# Patient Record
Sex: Male | Born: 1935 | Race: White | Hispanic: No | Marital: Single
Health system: Southern US, Community
[De-identification: ages and names within clinical notes are randomized; demographics above are authoritative.]

---

## 2011-02-27 ENCOUNTER — Ambulatory Visit: Payer: Self-pay | Admitting: Internal Medicine

## 2011-03-15 ENCOUNTER — Inpatient Hospital Stay: Payer: Self-pay | Admitting: Internal Medicine

## 2011-03-29 ENCOUNTER — Ambulatory Visit: Payer: Self-pay | Admitting: Internal Medicine

## 2011-08-26 ENCOUNTER — Observation Stay: Payer: Self-pay | Admitting: Specialist

## 2011-08-26 LAB — URINALYSIS, COMPLETE
Bilirubin,UR: NEGATIVE
Glucose,UR: NEGATIVE mg/dL (ref 0–75)
Hyaline Cast: 3
Leukocyte Esterase: NEGATIVE
Nitrite: POSITIVE
Protein: NEGATIVE
Specific Gravity: 1.012 (ref 1.003–1.030)
Squamous Epithelial: 1

## 2011-08-26 LAB — COMPREHENSIVE METABOLIC PANEL
Albumin: 3.1 g/dL — ABNORMAL LOW (ref 3.4–5.0)
Anion Gap: 8 (ref 7–16)
BUN: 66 mg/dL — ABNORMAL HIGH (ref 7–18)
Bilirubin,Total: 0.6 mg/dL (ref 0.2–1.0)
Chloride: 97 mmol/L — ABNORMAL LOW (ref 98–107)
Co2: 31 mmol/L (ref 21–32)
Glucose: 121 mg/dL — ABNORMAL HIGH (ref 65–99)
SGPT (ALT): 16 U/L
Sodium: 136 mmol/L (ref 136–145)
Total Protein: 9.2 g/dL — ABNORMAL HIGH (ref 6.4–8.2)

## 2011-08-26 LAB — CK TOTAL AND CKMB (NOT AT ARMC): CK, Total: 437 U/L — ABNORMAL HIGH (ref 35–232)

## 2011-08-26 LAB — CBC WITH DIFFERENTIAL/PLATELET
Basophil #: 0 10*3/uL (ref 0.0–0.1)
Basophil %: 0.3 %
Eosinophil #: 0 10*3/uL (ref 0.0–0.7)
HGB: 12.5 g/dL — ABNORMAL LOW (ref 13.0–18.0)
Lymphocyte #: 1.1 10*3/uL (ref 1.0–3.6)
Lymphocyte %: 7.7 %
MCHC: 33.1 g/dL (ref 32.0–36.0)
Monocyte #: 1 x10 3/mm (ref 0.2–1.0)
Monocyte %: 7.1 %
RDW: 15.8 % — ABNORMAL HIGH (ref 11.5–14.5)

## 2011-09-27 DEATH — deceased

## 2013-08-07 IMAGING — CT CT HEAD WITHOUT CONTRAST
2 series · 16 of 30 positions shown, 20 images · non-contrast
Comparison: none

REASON FOR EXAM: ams
COMMENTS:

PROCEDURE:     CT  - CT HEAD WITHOUT CONTRAST  - August 26, 2011  [DATE]
RESULT:     History: Altered mental status.

[Series 2: without · axial · non-contrast · 0.42mm/px · z∈[+172,+308]mm · 13 of 33 slices shown, 17 images]
[im 3/33  brain]
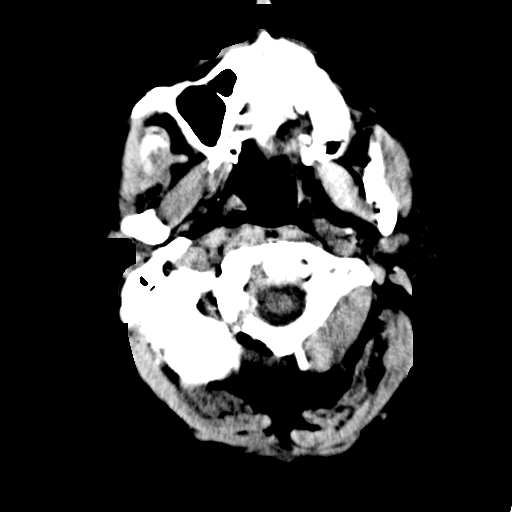
[im 3/33  bone]
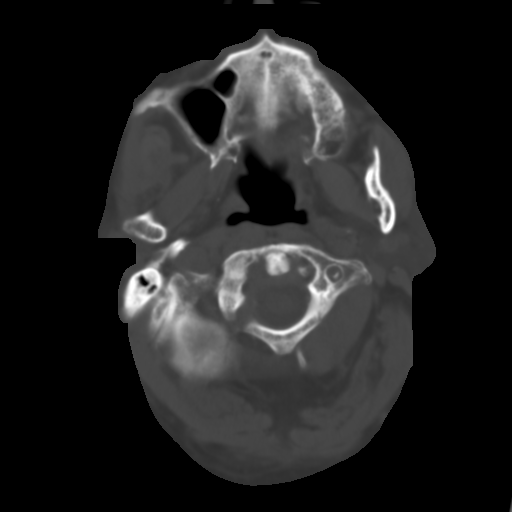
[im 5/33  brain]
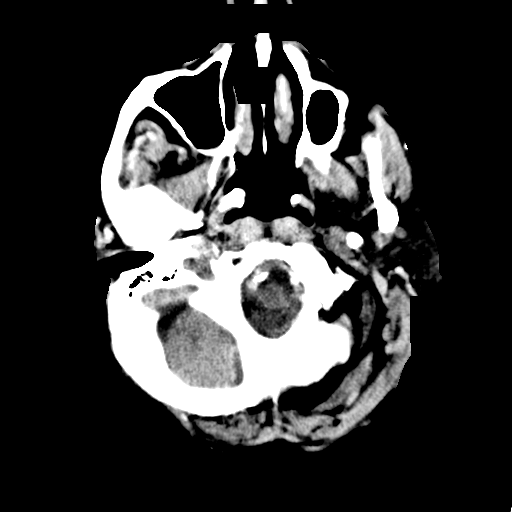
[im 7/33  brain]
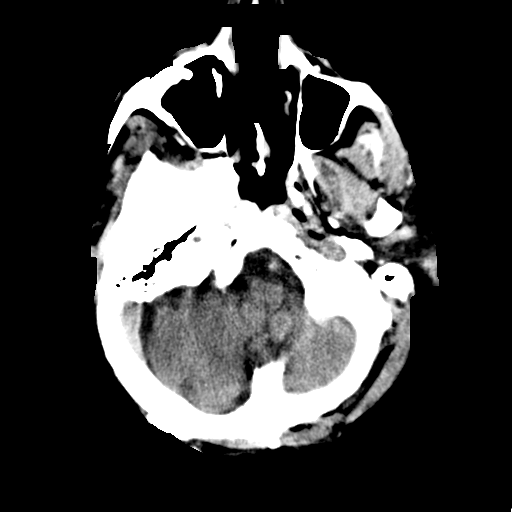
[im 10/33  brain]
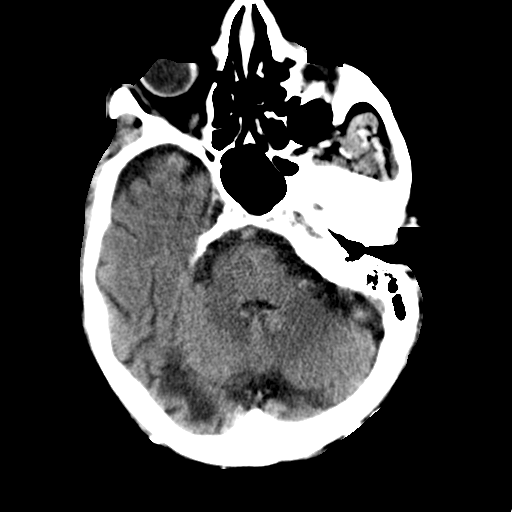
[im 12/33  brain]
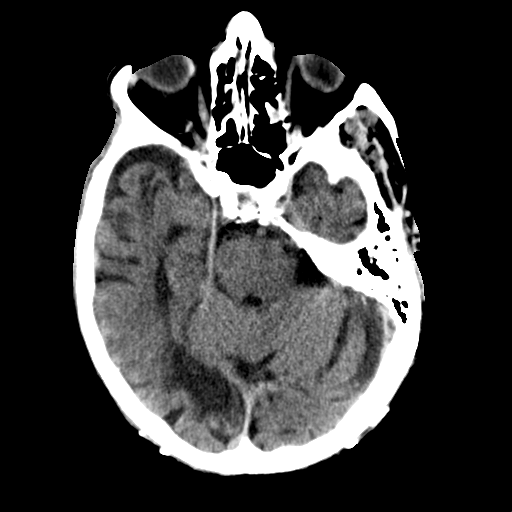
[im 12/33  bone]
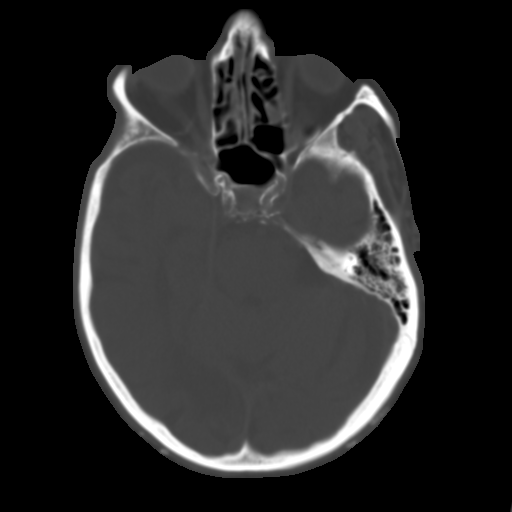
[im 14/33  brain]
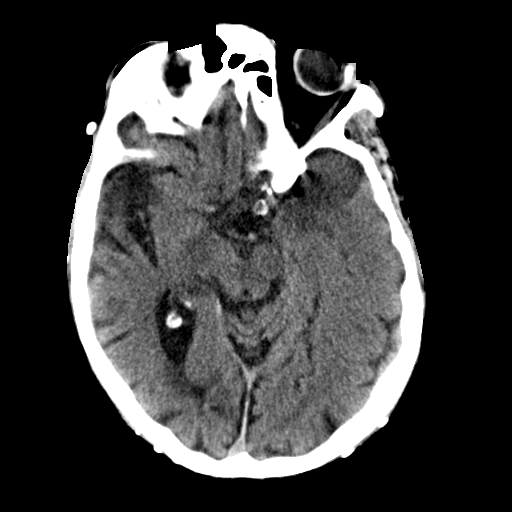
[im 17/33  brain]
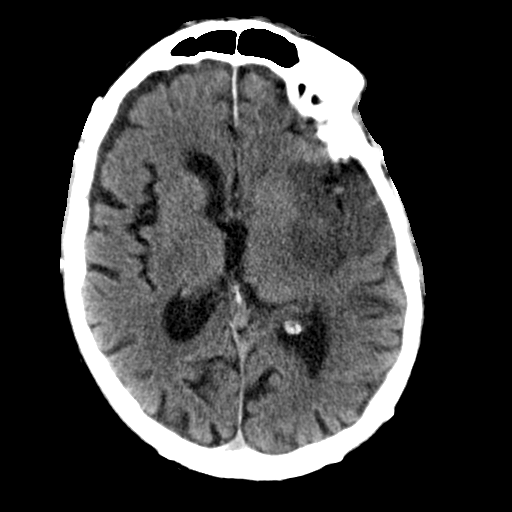
[im 19/33  brain]
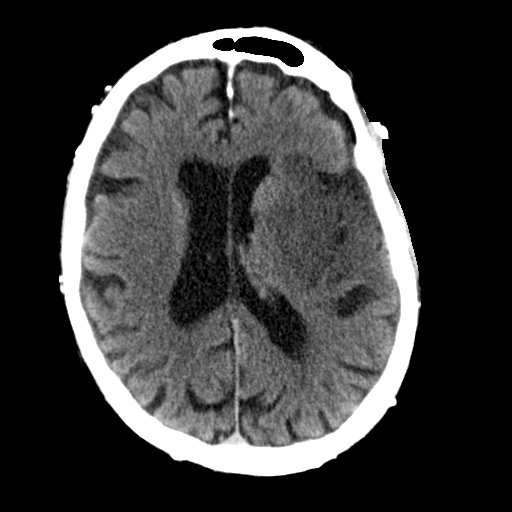
[im 21/33  brain]
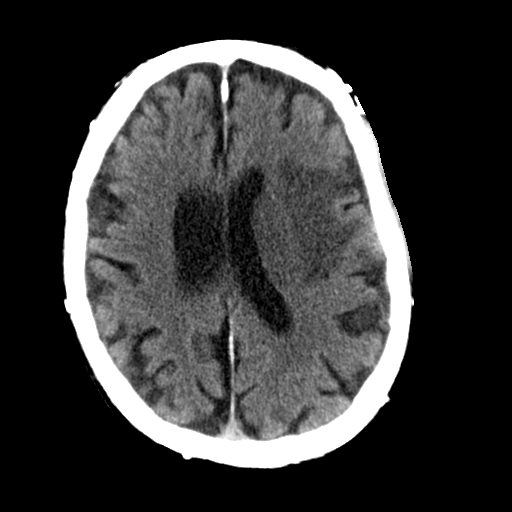
[im 21/33  bone]
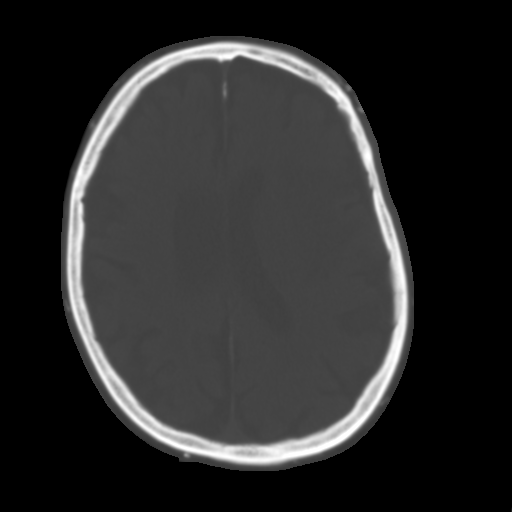
[im 23/33  brain]
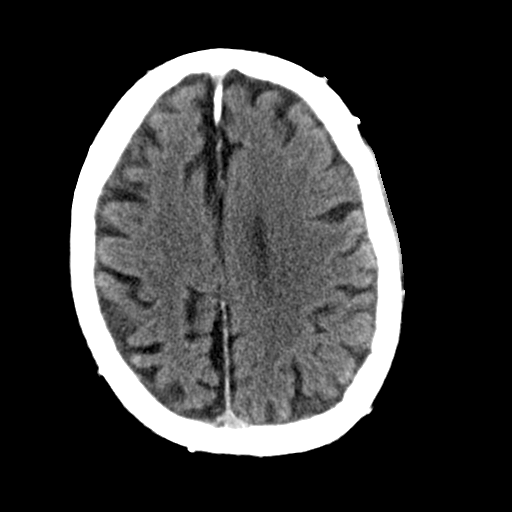
[im 26/33  brain]
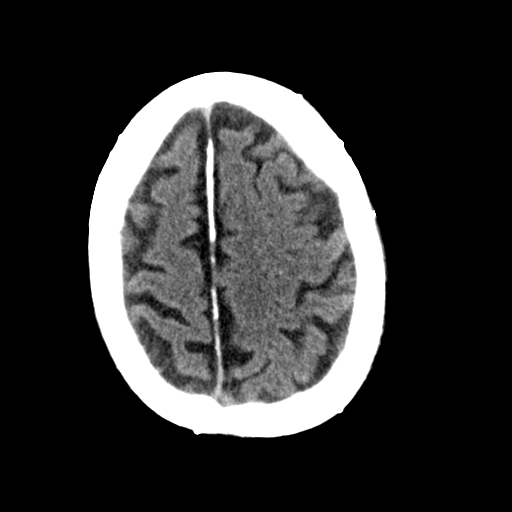
[im 28/33  brain]
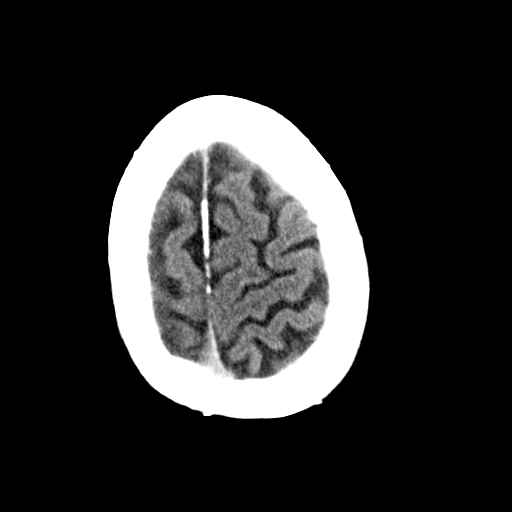
[im 30/33  brain]
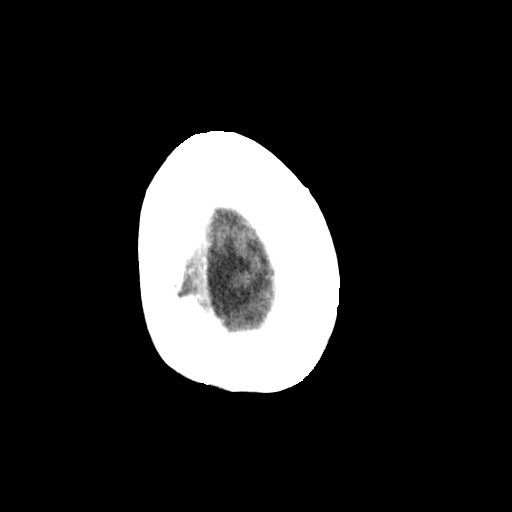
[im 30/33  bone]
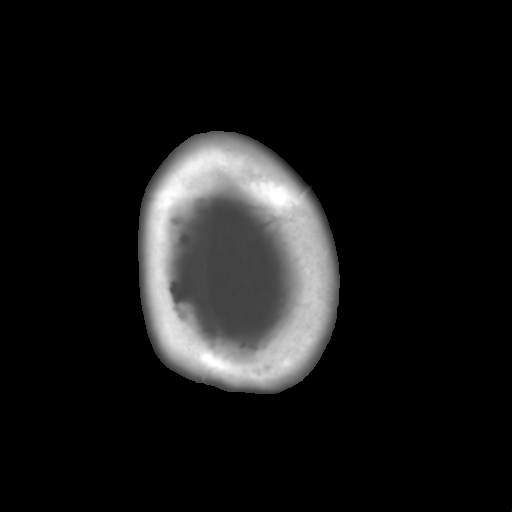

[Series 3: bone · axial · 0.42mm/px · z∈[+172,+218]mm · 3 of 33 slices shown]
[im 3/33  bone]
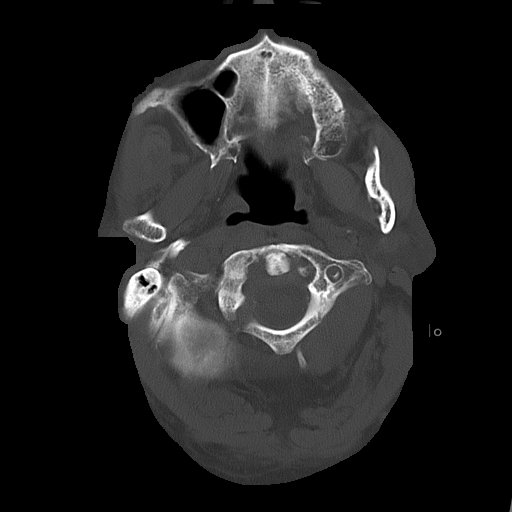
[im 7/33  bone]
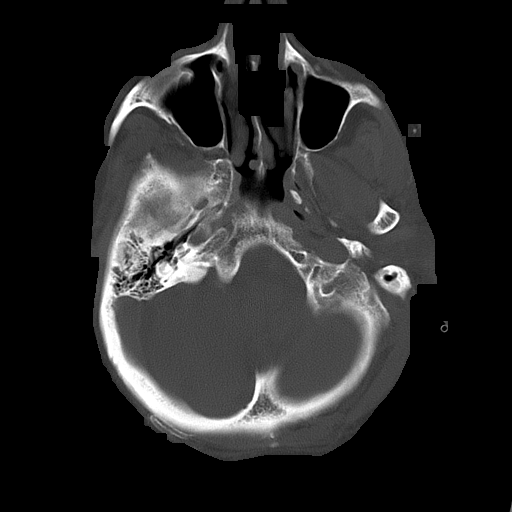
[im 12/33  bone]
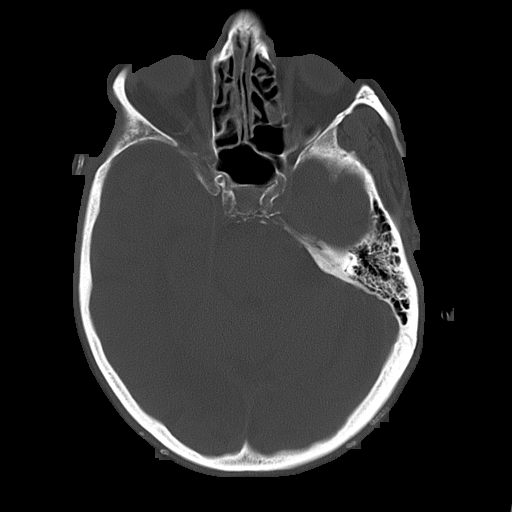

[16 of 30 positions shown; findings below may reference images not displayed]

FINDINGS: Standard nonenhanced CT obtained. Diffuse left temporal lucency
noted is most consistent with a large subacute stroke. Underlying tumor with
surrounding edema could also present this fashion. The left middle cerebral
artery is hyperdense. This is most likely from thrombosis of the left middle
cerebral artery. T Old right occipital stroke noted. No acute hemorrhage. No
acute bony abnormality.
IMPRESSION: Findings most consistent with level left middle cerebral
artery occlusion and a large left temporal subacute stroke. Prominent left
temporal lobe edema noted.

## 2014-08-20 NOTE — H&P (Signed)
PATIENT NAME:  Joseph KapurDEMATO, Joseph MR#:  [id number] DATE OF BIRTH:  [birth year]  DATE OF ADMISSION:  08/26/2011  ADMITTING PHYSICIAN:  Dr. Enid Baasadhika Khara Renaud PRIMARY CARE PHYSICIAN:  Dr. Marchia [facility]iersHoltz, [facility]  CHIEF COMPLAINT: Altered mental status.  HISTORY OF PRESENT ILLNESS: Mr. Joseph BernheimD'Amato is a 81 year old Caucasian male with past medical history significant for congestive heart failure with ejection fraction 35%, atrial fibrillation who was on Coumadin up until two weeks ago and was recently taken off of it, and gastroesophageal reflux disease, brought in from home secondary to decreased responsiveness. The patient never married and has no children. His primary contact and next of kin is his nephew, [name], at bedside. Most of the history is obtained from the patient's nephew as the patient is aphasic at this time. According to the nephew, the patient lives at home by himself. He is very functional, walks around, feeds himself, very communicative.  He has been followed by home Hospice for the past one year. The nephew last saw the patient yesterday evening around 5 or 5:30 p.m. and the patient was at his at baseline, talking and walking. This morning he went to check on the patient. The Hospice nurse was there, too.  The patient was sleeping in his room and they initially thought it was his regular nap, but when they tried to rouse he was not able to move.  Currently he is lying in bed on my exam, aphasic, just moaning at times and not moving the right side of his body. Labs show acute on chronic renal failure and CT of the head shows large left temporal subacute stroke and possible left middle cerebral artery occlusion, and prominent left temporal lobe edema noted. He also has a urinary tract infection. The patient's nephew expressed interest in admitting the patient to the Hospice home but still continuing medications for his urinary tract infection and anticoagulation for his stroke. No beds today at the  Hospice home.  He probably will be transferred to the Hospice home tomorrow so will admit under observation.   PAST MEDICAL HISTORY:  1. Cardiomyopathy with ejection fraction 35%.  2. Atrial fibrillation, just taken off Coumadin two weeks ago.  3. Gastroesophageal reflux disease.  4. Benign prostatic hypertrophy.  5. History of prostate cancer.  6. Chronic kidney disease with baseline creatinine of around 1.8.   PAST SURGICAL HISTORY: None.   ALLERGIES: No known drug allergies.  HOME MEDICATIONS: 1. Aspirin 81 mg p.o. daily.  2. Colace 100 mg p.o. daily.  3. Lasix 40 mg p.o. in the morning and 20 mg at bedtime.  4. Metolazone 5 mg p.o. daily.  5. Prilosec 20 mg p.o. daily. 6. Phenazopyridine  200 mg p.o. daily. 7. Potassium chloride 20 mEq p.o. daily.  8. Strovite advanced oral tablet in the morning.  9. Flomax  0.4 mg p.o. daily.   SOCIAL HISTORY: Lives at home by himself, followed by Hospice at home. No history of any smoking. Drinks about two beers every day. Never married. No children. His nephew [name] is the primary contact person.   FAMILY HISTORY: Not known to his nephew.   REVIEW OF SYSTEMS: Difficult to obtain as the patient is aphasic and altered at this time.   PHYSICAL EXAMINATION:  VITAL SIGNS: Temperature 95.9 degrees Fahrenheit, pulse 84, respirations 14, blood pressure 175/86, pulse oximetry 91% on room air.   GENERAL: Elderly male lying in bed, not in any acute distress.   HEENT: Normocephalic, atraumatic.  Pupils with  left lateral deviation. Anicteric sclerae. Oropharynx- very dry mucous membranes. Aphasic at this time.  NECK: Supple. No thyromegaly or jugular venous distention or carotid bruits.  No lymphadenopathy.   LUNGS: Moving air bilaterally. No wheeze or crackles. No use of accessory muscles for breathing. Decreased bibasilar breath sounds.   CARDIOVASCULAR: S1 and S2.  Irregular rhythm with normal rate. 3/6 systolic murmur.   ABDOMEN:  Soft, nontender, nondistended. No hepatosplenomegaly. Normal bowel sounds.   EXTREMITIES: No pedal edema. No clubbing or cyanosis.  Feeble dorsalis pedis pulses palpable bilaterally.   SKIN: No acne, rash, or lesions.   LYMPHATICS: No cervical lymphadenopathy.   NEUROLOGIC: Difficult to do a neurological examination as the patient is not completely cooperative.  He is restless. He is able to move his left upper extremity. Minimal left lower extremity movement. Not able to move his right upper and lower extremities during my exam. Also remains aphasic. No obvious facial drooping is noted at that this time.  His gaze is mostly toward his left lateral side. He probably has decreased hearing in his right ear and has a hearing aid in place.   LABORATORY, DIAGNOSTIC, AND RADIOLOGICAL DATA: WBC 14.4, hemoglobin 12.5, hematocrit 37.8, platelet count 264.   Sodium 136, potassium 3.1, chloride 97, bicarbonate 31, BUN 66, creatinine 2, glucose 121, calcium 9.4. ALT 16, AST 29, alkaline phosphatase 106, total bili 0.6, albumin 3.1. Cardiac enzymes: CK 437, CK-MB 12.3. Troponin 0.03. CT of the head without contrast showing findings consistent with left middle cerebral artery occlusion and large left temporal subacute stroke. Prominent left temporal lobe edema is seen. Urinalysis with positive nitrite and 2+ bacteria. Chest x-ray showing minimal edema or early pneumonia on the left side. The pulmonary edema findings have resolved. Stable cardiomegaly is seen. EKG is showing atrial fibrillation, rate controlled at 66.   ASSESSMENT AND PLAN: 81 year old male with past medical history significant for congestive heart failure, ejection fraction of 35%, atrial fibrillation, who was recently taken off of Coumadin, brought in for right-sided weakness and aphasia and CT of the head showing large left temporal infarct with some cerebral edema. Also urinary tract infection noted on labs. The patient is already followed by  Hospice at home. Now the family is interested in Hospice home. No beds today so he will admitted to the Hospice home tomorrow.  1. Cerebrovascular accident, likely embolic in nature from his atrial fibrillation and congestive heart failure and was recently taken off of Coumadin. CT of the head with left MCA occlusion, infarct, and cerebral edema. Since the family is interested in Hospice home, we will not order MRI, carotid Doppler's, or echo.  We will keep him off of Coumadin especially with the cerebral edema; the risk of bleeding is high. Can continue aspirin if he can swallow pills and Decadron p.o. for his cerebral edema. Dysphagia diet with honey thick liquids. We will admit under observation and discharged to the Hospice home tomorrow after a bed is available. We will hold off on ordering telemetry as the patient is very restless and trying to pull off his telemetry leads while in the ED. Add Roxanol and Ativan p.r.n. for comfort.  2. Urinary tract infection. Cultures ordered. Place on p.o. Levaquin.  3. Atrial fibrillation, currently only on aspirin. We will hold off on Coumadin as the patient will be discharged to the Hospice home. 4. Congestive heart failure. Improved pulmonary edema from prior chest x-ray. Hold IV fluids and also his diuretics.   5. Acute renal  failure on CKD probably from dehydration. Hold off on his diuretics. We will hold off on IV fluids also with recent pulmonary edema episode. The patient will be going to Hospice home.  6. Gastroesophageal reflux disease. Continue ranitidine.  7. CODE STATUS: DO NOT RESUSCITATE.  Likely discharge to Hospice home tomorrow.   TIME SPENT ON ADMISSION: 50 minutes.  ____________________________ Enid Baas, MD [id number] D: 08/26/2011 17:00:46 ET T: 08/26/2011 17:54:06 ET JOB#: [id number]  cc: Enid Baas, MD, <Dictator> Enid Baas MD ELECTRONICALLY SIGNED 09-15-2011 14:25

## 2014-08-20 NOTE — Discharge Summary (Signed)
PATIENT NAME:  Joseph KapurDEMATO, Cerrone MR#:  [id number] DATE OF BIRTH:  1913/06/23  DATE OF ADMISSION:  08/26/2011 DATE OF DISCHARGE:  08/27/2011  For a detailed note, please see the History and Physical done by Dr. Nemiah CommanderKalisetti on admission.   DIAGNOSIS: Acute left large temporal cerebrovascular accident.  BRIEF HOSPITAL COURSE: This is a 81 year old male who presented to the hospital on 08/26/2011 due to altered mental status and having right-sided paralysis and aphasia. He presented to the hospital and was noted to have a large MCA occlusion temporal cerebrovascular accident, likely embolic in nature. The patient was admitted to the hospital only for comfort care as his prognosis was fairly poor. The patient was seen by Hospice services and the family agreed to transfer to Hospice home with the patient's prognosis being extremely poor after a massive cerebrovascular accident. He was transferred to the Hospice home for further care. The patient was a DO NOT INTUBATE, DO NOT RESUSCITATE upon presenting to the hospital.   TIME SPENT ON DISCHARGE: 30 minutes.    ____________________________ Rolly PancakeVivek J. Cherlynn KaiserSainani, MD [email or web address] D: 08/27/2011 15:59:49 ET T: 08/28/2011 14:58:08 ET JOB#: [id number]  cc: Rolly PancakeVivek J. Cherlynn KaiserSainani, MD, <Dictator> Houston SirenVIVEK J SAINANI MD ELECTRONICALLY SIGNED 08/29/2011 8:11
